# Patient Record
Sex: Female | Born: 1990 | Race: Black or African American | Hispanic: No | Marital: Single | Smoking: Former smoker
Health system: Southern US, Community
[De-identification: ages and names within clinical notes are randomized; demographics above are authoritative.]

---
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
## 2014-06-17 ENCOUNTER — Emergency Department (HOSPITAL_BASED_OUTPATIENT_CLINIC_OR_DEPARTMENT_OTHER)
Admission: EM | Admit: 2014-06-17 | Discharge: 2014-06-17 | Disposition: A | Discharge status: Home / Self Care | Payer: Self-pay | Attending: Emergency Medicine | Admitting: Emergency Medicine

## 2014-06-17 ENCOUNTER — Emergency Department (HOSPITAL_BASED_OUTPATIENT_CLINIC_OR_DEPARTMENT_OTHER): Payer: Self-pay

## 2014-06-17 ENCOUNTER — Encounter (HOSPITAL_BASED_OUTPATIENT_CLINIC_OR_DEPARTMENT_OTHER): Payer: Self-pay | Admitting: *Deleted

## 2014-06-17 DIAGNOSIS — O26899 Other specified pregnancy related conditions, unspecified trimester: Secondary | ICD-10-CM

## 2014-06-17 DIAGNOSIS — F17210 Nicotine dependence, cigarettes, uncomplicated: Secondary | ICD-10-CM | POA: Insufficient documentation

## 2014-06-17 DIAGNOSIS — O26851 Spotting complicating pregnancy, first trimester: Secondary | ICD-10-CM

## 2014-06-17 DIAGNOSIS — R109 Unspecified abdominal pain: Secondary | ICD-10-CM

## 2014-06-17 DIAGNOSIS — O034 Incomplete spontaneous abortion without complication: Secondary | ICD-10-CM

## 2014-06-17 DIAGNOSIS — N939 Abnormal uterine and vaginal bleeding, unspecified: Secondary | ICD-10-CM

## 2014-06-17 DIAGNOSIS — O99331 Smoking (tobacco) complicating pregnancy, first trimester: Secondary | ICD-10-CM | POA: Insufficient documentation

## 2014-06-17 DIAGNOSIS — Z3A11 11 weeks gestation of pregnancy: Secondary | ICD-10-CM | POA: Insufficient documentation

## 2014-06-17 LAB — CBC WITH DIFFERENTIAL/PLATELET
BASOS ABS: 0 10*3/uL (ref 0.0–0.1)
BASOS PCT: 0 % (ref 0–1)
Eosinophils Absolute: 0.1 10*3/uL (ref 0.0–0.7)
Eosinophils Relative: 2 % (ref 0–5)
HEMATOCRIT: 30.3 % — AB (ref 36.0–46.0)
Hemoglobin: 10.3 g/dL — ABNORMAL LOW (ref 12.0–15.0)
Lymphocytes Relative: 42 % (ref 12–46)
Lymphs Abs: 2.8 10*3/uL (ref 0.7–4.0)
MCH: 28.1 pg (ref 26.0–34.0)
MCHC: 34 g/dL (ref 30.0–36.0)
MCV: 82.8 fL (ref 78.0–100.0)
MONO ABS: 0.5 10*3/uL (ref 0.1–1.0)
Monocytes Relative: 8 % (ref 3–12)
NEUTROS ABS: 3.1 10*3/uL (ref 1.7–7.7)
Neutrophils Relative %: 48 % (ref 43–77)
PLATELETS: 217 10*3/uL (ref 150–400)
RBC: 3.66 MIL/uL — ABNORMAL LOW (ref 3.87–5.11)
RDW: 13.1 % (ref 11.5–15.5)
WBC: 6.6 10*3/uL (ref 4.0–10.5)

## 2014-06-17 LAB — HCG, QUANTITATIVE, PREGNANCY: hCG, Beta Chain, Quant, S: 4576 m[IU]/mL — ABNORMAL HIGH (ref <5)

## 2014-06-17 LAB — BASIC METABOLIC PANEL
Anion gap: 3 — ABNORMAL LOW (ref 5–15)
BUN: 12 mg/dL (ref 6–23)
CO2: 24 mmol/L (ref 19–32)
Calcium: 9 mg/dL (ref 8.4–10.5)
Chloride: 107 mmol/L (ref 96–112)
Creatinine, Ser: 0.37 mg/dL — ABNORMAL LOW (ref 0.50–1.10)
GFR calc Af Amer: >90 mL/min (ref >90)
Glucose, Bld: 97 mg/dL (ref 70–99)
POTASSIUM: 3.8 mmol/L (ref 3.5–5.1)
Sodium: 134 mmol/L — ABNORMAL LOW (ref 135–145)

## 2014-06-17 LAB — URINALYSIS, ROUTINE W REFLEX MICROSCOPIC
BILIRUBIN URINE: NEGATIVE
Glucose, UA: NEGATIVE mg/dL
Ketones, ur: NEGATIVE mg/dL
LEUKOCYTES UA: NEGATIVE
NITRITE: NEGATIVE
Protein, ur: NEGATIVE mg/dL
Specific Gravity, Urine: 1.014 (ref 1.005–1.030)
Urobilinogen, UA: 0.2 mg/dL (ref 0.0–1.0)
pH: 6 (ref 5.0–8.0)

## 2014-06-17 LAB — WET PREP, GENITAL
TRICH WET PREP: NONE SEEN
YEAST WET PREP: NONE SEEN

## 2014-06-17 LAB — URINE MICROSCOPIC-ADD ON

## 2014-06-17 LAB — ABO/RH: ABO/RH(D): O POS

## 2014-06-17 LAB — PREGNANCY, URINE: PREG TEST UR: POSITIVE — AB

## 2014-06-17 NOTE — ED Provider Notes (Signed)
CSN: [id number]     Arrival date & time 06/17/14  1443 History   First MD Initiated Contact with Patient 06/17/14 1629     Chief Complaint  Patient presents with  . Abdominal Pain     (Consider location/radiation/quality/duration/timing/severity/associated sxs/prior Treatment) HPI Jean Duncan is a 24 year old female G1 P0 at 5611 weeks gestation who presents the ER complaining of vaginal bleeding. Patient reports she was seen and evaluated in the ER in [facility] 5 days ago for a pink vaginal discharge and lower abdominal cramping. She reports over the weekend she began having postcoital bleeding on Saturday, and her bleeding has persisted for the past several days to the point that she has had to change her pad approximately 1 time per day. Patient denies having any persistent abdominal pain, nausea, vomiting, diarrhea. Patient denies having sexual intercourse since Saturday.  History reviewed. No pertinent past medical history. History reviewed. No pertinent past surgical history. No family history on file. History  Substance Use Topics  . Smoking status: Current Every Day Smoker -- 0.00 packs/day    Types: Cigarettes  . Smokeless tobacco: Not on file  . Alcohol Use: No   OB History    No data available     Review of Systems  Constitutional: Negative for fever.  HENT: Negative for trouble swallowing.   Eyes: Negative for visual disturbance.  Respiratory: Negative for shortness of breath.   Cardiovascular: Negative for chest pain.  Gastrointestinal: Negative for nausea, vomiting and abdominal pain.  Genitourinary: Positive for vaginal bleeding. Negative for dysuria, vaginal discharge and vaginal pain.  Musculoskeletal: Negative for neck pain.  Skin: Negative for rash.  Neurological: Negative for dizziness, weakness and numbness.  Psychiatric/Behavioral: Negative.       Allergies  Review of patient's allergies indicates no known allergies.  Home  Medications   Prior to Admission medications   Not on File   BP 126/61 mmHg  Pulse 77  Temp(Src) 99 F (37.2 C) (Oral)  Resp 16  Ht 5\' 7"  (1.702 m)  Wt 179 lb (81.194 kg)  BMI 28.03 kg/m2  SpO2 100%  LMP 04/04/2014 Physical Exam  Constitutional: She is oriented to person, place, and time. She appears well-developed and well-nourished. No distress.  HENT:  Head: Normocephalic and atraumatic.  Mouth/Throat: Oropharynx is clear and moist. No oropharyngeal exudate.  Eyes: Right eye exhibits no discharge. Left eye exhibits no discharge. No scleral icterus.  Neck: Normal range of motion.  Cardiovascular: Normal rate, regular rhythm and normal heart sounds.   No murmur heard. Pulmonary/Chest: Effort normal and breath sounds normal. No respiratory distress.  Abdominal: Soft. Normal appearance and bowel sounds are normal. There is no tenderness. There is no rigidity, no guarding, no tenderness at McBurney's point and negative Murphy's sign.  Genitourinary: No labial fusion. There is no rash, tenderness, lesion or injury on the right labia. There is no rash, tenderness, lesion or injury on the left labia. There is bleeding in the vagina. No erythema or tenderness in the vagina. No foreign body around the vagina. No signs of injury around the vagina.  Mild to moderate amount of dark red blood noted in vaginal vault. No active bleed noted. No cervical motion tenderness, adnexal tenderness, cervical friability or discharge. Chaperone present during entire pelvic exam.  Musculoskeletal: Normal range of motion. She exhibits no edema or tenderness.  Neurological: She is alert and oriented to person, place, and time. No cranial nerve deficit. Coordination normal.  Skin: Skin is  warm and dry. No rash noted. She is not diaphoretic.  Psychiatric: She has a normal mood and affect.  Nursing note and vitals reviewed.   ED Course  Procedures (including critical care time) Labs Review Labs Reviewed   WET PREP, GENITAL - Abnormal; Notable for the following:    Clue Cells Wet Prep HPF POC MANY (*)    WBC, Wet Prep HPF POC MODERATE (*)    All other components within normal limits  URINALYSIS, ROUTINE W REFLEX MICROSCOPIC - Abnormal; Notable for the following:    Hgb urine dipstick TRACE (*)    All other components within normal limits  CBC WITH DIFFERENTIAL/PLATELET - Abnormal; Notable for the following:    RBC 3.66 (*)    Hemoglobin 10.3 (*)    HCT 30.3 (*)    All other components within normal limits  BASIC METABOLIC PANEL - Abnormal; Notable for the following:    Sodium 134 (*)    Creatinine, Ser 0.37 (*)    Anion gap 3 (*)    All other components within normal limits  PREGNANCY, URINE - Abnormal; Notable for the following:    Preg Test, Ur POSITIVE (*)    All other components within normal limits  HCG, QUANTITATIVE, PREGNANCY - Abnormal; Notable for the following:    hCG, Beta Chain, Quant, S 4576 (*)    All other components within normal limits  URINE MICROSCOPIC-ADD ON  ABO/RH  GC/CHLAMYDIA PROBE AMP ([place])    Imaging Review US Ob Comp Less 14 Wks  06/17/2014   CLINICAL DATA:  Initial evaluation for bleeding during intercourse, no pain, with seen in the emergency department on 06/12/2014 for small amount of vaginal bleeding with intercourse again on 06/13/2014  EXAM: OBSTETRIC <14 WK [place] AND TRANSVAGINAL OB US  TECHNIQUE: Both transabdominal and transvaginal ultrasound examinations were performed for complete evaluation of the gestation as well as the maternal uterus, adnexal regions, and pelvic cul-de-sac. Transvaginal technique was performed to assess early pregnancy.  COMPARISON:  06/12/2014  FINDINGS: Intrauterine gestational sac: Gestational sac is identified within the endometrial canal of the lower uterine segment/body junction, representing a definitive change in position when compared to the prior study.  Yolk sac:  Not identified  Embryo:  Not identified   Cardiac Activity: Not identified  Heart Rate: 0  bpm  Sac has decreased in size to 15 and 6mm from previous measurement of 29 x 15mm.  Maternal uterus/adnexae: No subchorionic hemorrhage. Right ovary is normal. Left ovary demonstrates a corpus luteum.  IMPRESSION: The findings appear consistent with an in progress abortion.   Electronically Signed   By: Esperanza Heir M.D.   On: 06/17/2014 20:20   US Ob Transvaginal  06/17/2014   CLINICAL DATA:  Initial evaluation for bleeding during intercourse, no pain, with seen in the emergency department on 06/12/2014 for small amount of vaginal bleeding with intercourse again on 06/13/2014  EXAM: OBSTETRIC <14 WK [place] AND TRANSVAGINAL OB US  TECHNIQUE: Both transabdominal and transvaginal ultrasound examinations were performed for complete evaluation of the gestation as well as the maternal uterus, adnexal regions, and pelvic cul-de-sac. Transvaginal technique was performed to assess early pregnancy.  COMPARISON:  06/12/2014  FINDINGS: Intrauterine gestational sac: Gestational sac is identified within the endometrial canal of the lower uterine segment/body junction, representing a definitive change in position when compared to the prior study.  Yolk sac:  Not identified  Embryo:  Not identified  Cardiac Activity: Not identified  Heart Rate: 0  bpm  Sac has decreased in size to 15 and 6mm from previous measurement of 29 x 15mm.  Maternal uterus/adnexae: No subchorionic hemorrhage. Right ovary is normal. Left ovary demonstrates a corpus luteum.  IMPRESSION: The findings appear consistent with an in progress abortion.   Electronically Signed   By: Esperanza Heir M.D.   On: 06/17/2014 20:20     EKG Interpretation None      MDM   Final diagnoses:  Vaginal bleeding  Incomplete abortion    Patient here pregnant [redacted] weeks with recent evaluation and ultrasound at [facility] for vaginal discharge and lower abdominal cramping. Patient states her workup  was negative there, however I'm unable to access those records tonight. Patient reporting since being seen there she has experienced postcoital bleeding which has persisted. Patient states she had sexual intercourse once, and the bleeding has persisted since for the past 4 days. Patient is not expressing any lower abdominal pain. Workup for rule out of ectopic pregnancy. Patient is not tender on exam, moderate amount of vaginal bleeding noted in vaginal vault. No concern for a surgical abdomen, will follow up with transvaginal ultrasound OB. Patient's lab work unremarkable for acute pathology. Many clue cells noted on wet prep, however patient not experiencing any signs or symptoms of bacterial vaginosis.    Patient's ultrasound with impression: The findings appear consistent with an in progress abortion.  These findings correlated with patient's lower than expected beta hCG, and vaginal bleeding without pain or discomfort tonight, patient's symptoms are likely all attributed to a incomplete abortion. I discussed these results with patient, I strongly encourage her to follow-up with her OB/GYN. I discussed return precautions with patient to include worsening of bleeding or symptoms, and patient verbalizes understanding and agreement of this plan. I encouraged patient to call or return to the ER should she have any questions or concerns.  BP 126/61 mmHg  Pulse 77  Temp(Src) 99 F (37.2 C) (Oral)  Resp 16  Ht  (1.702 m)  Wt 179 lb (81.194 kg)  BMI 28.03 kg/m2  SpO2 100%  LMP 04/04/2014  Signed,  Ladona Mow, PA-C 11:44 PM  Patient discussed with Dr. Gerhard Munch, M.D.    Monte Fantasia, PA-C 06/17/14 2345  Gerhard Munch, MD 06/18/14 [phone]

## 2014-06-17 NOTE — Discharge Instructions (Signed)
Follow-up with your OB/GYN. Return to the ER with any severe bleeding greater than 1 pad per hour, severe abdominal or pelvic pain, high fever, nausea, vomiting.   Incomplete Miscarriage A miscarriage is the sudden loss of an unborn baby (fetus) before the 20th week of pregnancy. In an incomplete miscarriage, parts of the fetus or placenta (afterbirth) remain in the body.  Having a miscarriage can be an emotional experience. Talk with your health care provider about any questions you may have about miscarrying, the grieving process, and your future pregnancy plans. CAUSES   Problems with the fetal chromosomes that make it impossible for the baby to develop normally. Problems with the baby's genes or chromosomes are most often the result of errors that occur by chance as the embryo divides and grows. The problems are not inherited from the parents.  Infection of the cervix or uterus.  Hormone problems.  Problems with the cervix, such as having an incompetent cervix. This is when the tissue in the cervix is not strong enough to hold the pregnancy.  Problems with the uterus, such as an abnormally shaped uterus, uterine fibroids, or congenital abnormalities.  Certain medical conditions.  Smoking, drinking alcohol, or taking illegal drugs.  Trauma. SYMPTOMS   Vaginal bleeding or spotting, with or without cramps or pain.  Pain or cramping in the abdomen or lower back.  Passing fluid, tissue, or blood clots from the vagina. DIAGNOSIS  Your health care provider will perform a physical exam. You may also have an ultrasound to confirm the miscarriage. Blood or urine tests may also be ordered. TREATMENT   Usually, a dilation and curettage (D&C) procedure is performed. During a D&C procedure, the cervix is widened (dilated) and any remaining fetal or placental tissue is gently removed from the uterus.  Antibiotic medicines are prescribed if there is an infection. Other medicines may be given  to reduce the size of the uterus (contract) if there is a lot of bleeding.  If you have Rh negative blood and your baby was Rh positive, you will need a Rho (D) immune globulin shot. This shot will protect any future baby from having Rh blood problems in future pregnancies.  You may be confined to bed rest. This means you should stay in bed and only get up to use the bathroom. HOME CARE INSTRUCTIONS   Rest as directed by your health care provider.  Restrict activity as directed by your health care provider. You may be allowed to continue light activity if curettage was not done but you require further treatment.  Keep track of the number of pads you use each day. Keep track of how soaked (saturated) they are. Record this information.  Do not  use tampons.  Do not douche or have sexual intercourse until approved by your health care provider.  Keep all follow-up appointments for reevaluation and continuing management.  Only take over-the-counter or prescription medicines for pain, fever, or discomfort as directed by your health care provider.  Take antibiotic medicine as directed by your health care provider. Make sure you finish it even if you start to feel better. SEEK IMMEDIATE MEDICAL CARE IF:   You experience severe cramps in your stomach, back, or abdomen.  You have an unexplained temperature (make sure to record these temperatures).  You pass large clots or tissue (save these for your health care provider to inspect).  Your bleeding increases.  You become light-headed, weak, or have fainting episodes. MAKE SURE YOU:   Understand these  instructions.  Will watch your condition.  Will get help right away if you are not doing well or get worse. Document Released: 04/18/2005 Document Revised: 09/02/2013 Document Reviewed: 11/15/2012 [facility] Patient Information 2015 [place], [place]. This information is not intended to replace advice given to you by your health care provider.  Make sure you discuss any questions you have with your health care provider.

## 2014-06-17 NOTE — ED Notes (Addendum)
Abdominal pain since yesterday. Lower abdomen cramping and pink discharge. States she is [redacted] weeks pregnant.

## 2014-06-18 LAB — GC/CHLAMYDIA PROBE AMP (~~LOC~~) NOT AT ARMC
Chlamydia: NEGATIVE
Neisseria Gonorrhea: NEGATIVE

## 2015-06-29 IMAGING — US US OB COMP LESS 14 WK
1 series · 14 of 28 positions shown · non-contrast
Comparison: 06/12/2014

CLINICAL DATA: Initial evaluation for bleeding during intercourse,
no pain, with seen in the emergency department on 06/12/2014 for
small amount of vaginal bleeding with intercourse again on
06/13/2014

EXAM:
OBSTETRIC <14 WK US AND TRANSVAGINAL OB US
TECHNIQUE: Both transabdominal and transvaginal ultrasound examinations were
performed for complete evaluation of the gestation as well as the
maternal uterus, adnexal regions, and pelvic cul-de-sac.
Transvaginal technique was performed to assess early pregnancy.

[Series 1: us ob comp less 14 wk · 0.15mm/px · 37 acquisitions, 14 frames shown]
[im 2/37]
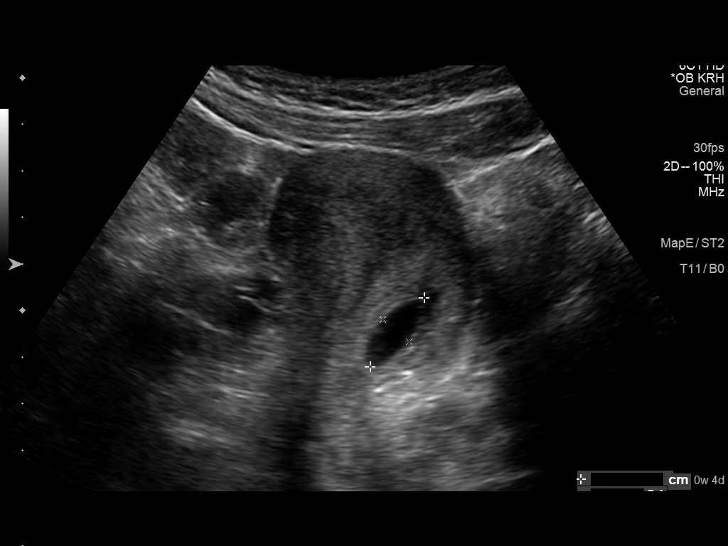
[im 5/37]
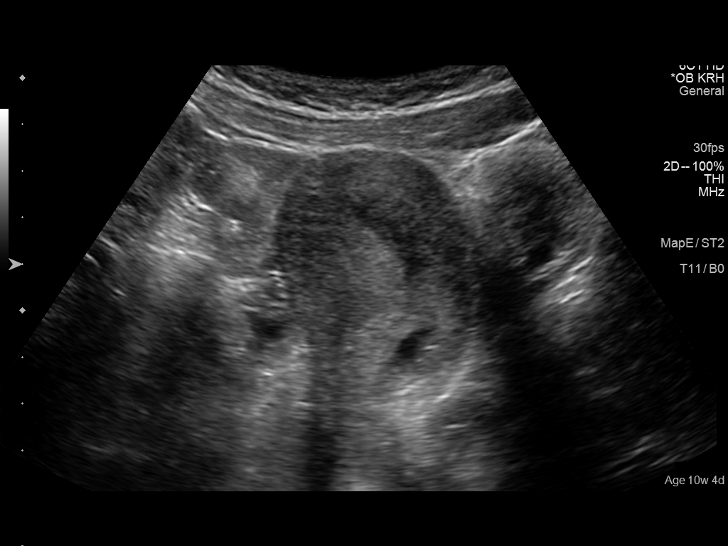
[im 7/37]
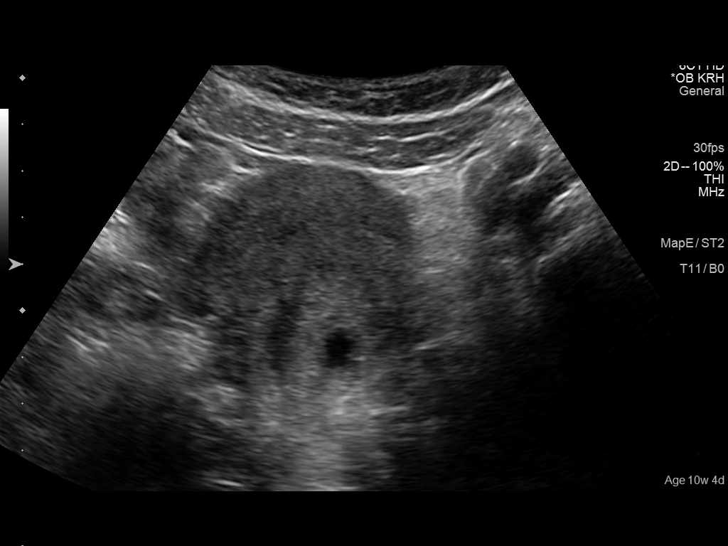
[im 10/37]
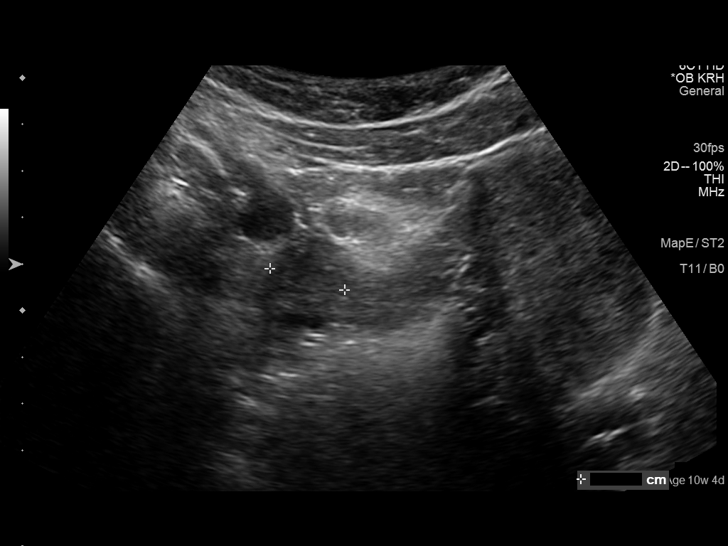
[im 13/37]
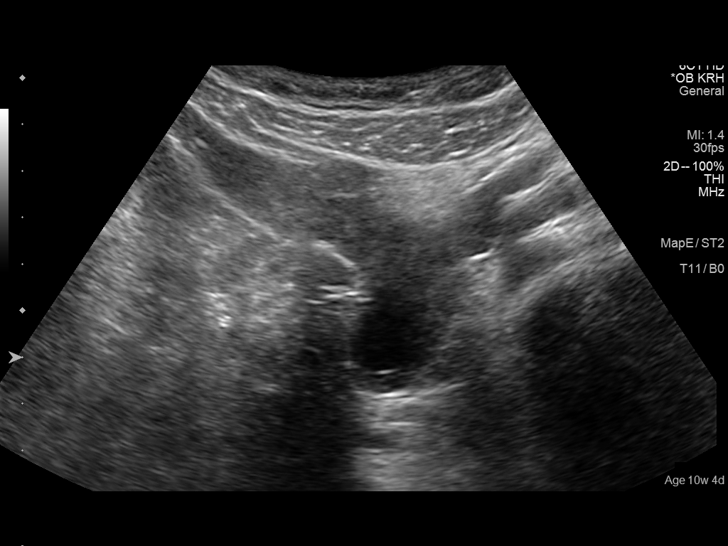
[im 15/37]
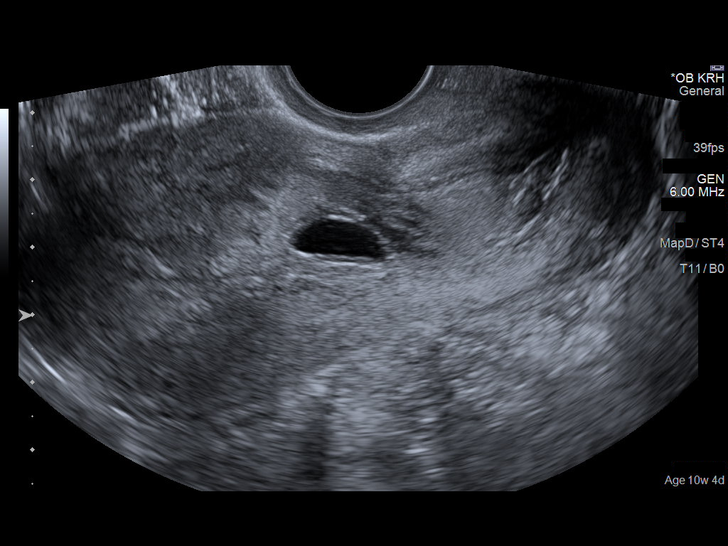
[im 18/37]
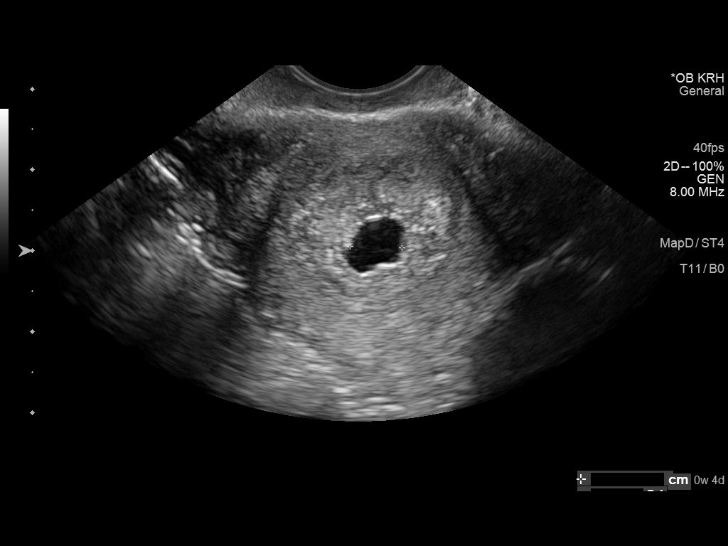
[im 21/37]
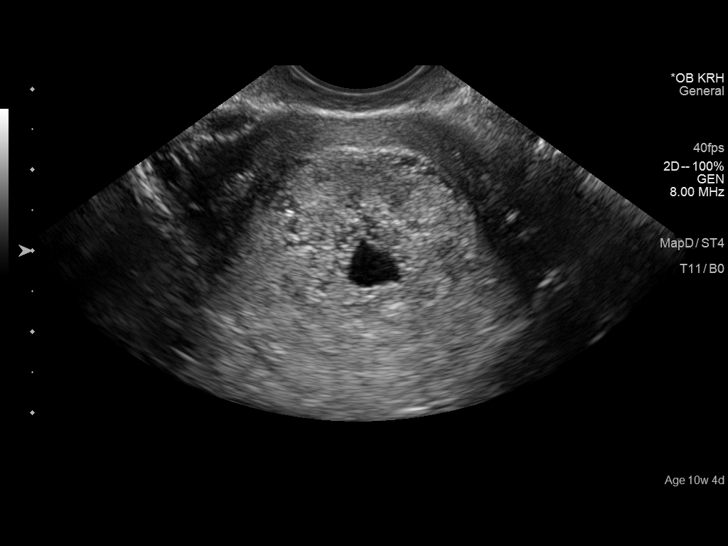
[im 23/37]
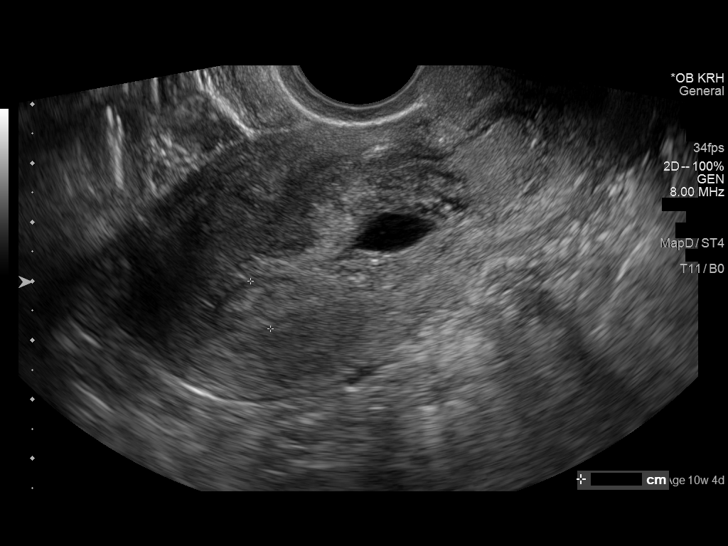
[im 26/37]
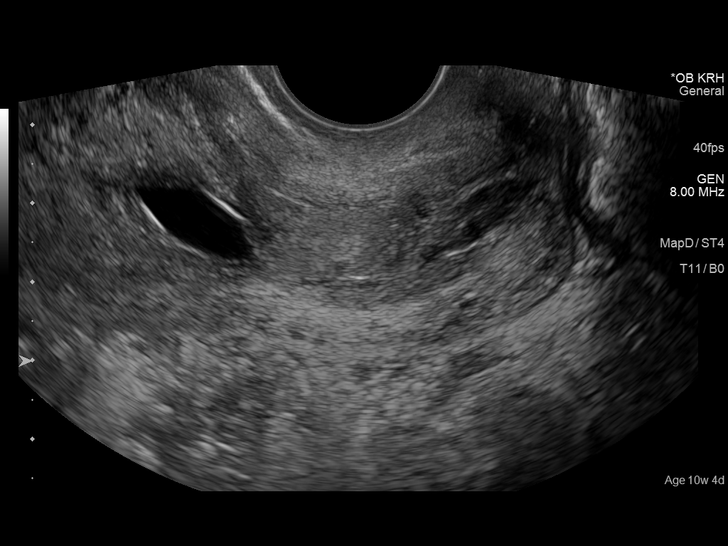
[im 29/37]
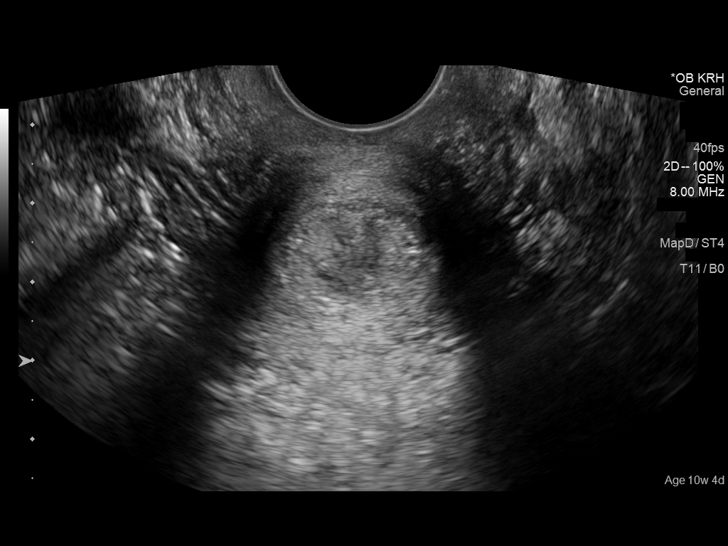
[im 31/37]
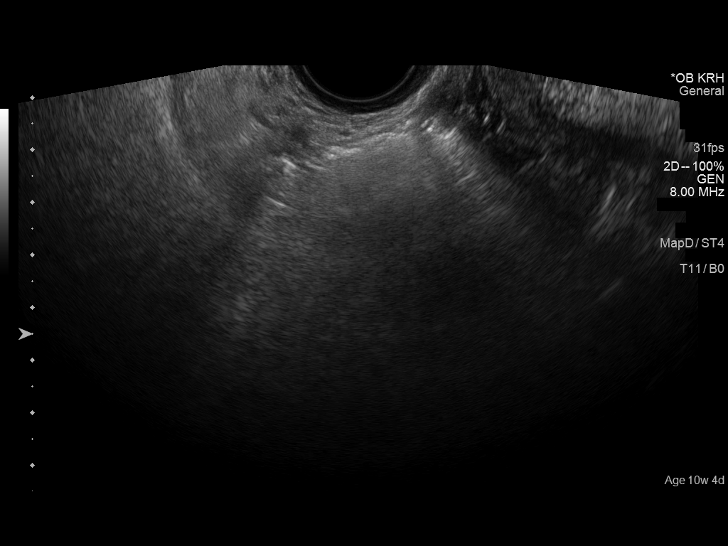
[im 34/37]
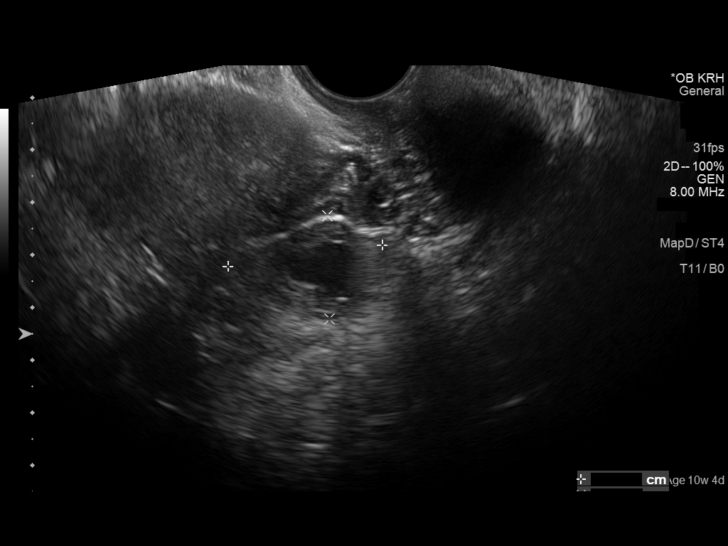
[im 37/37]
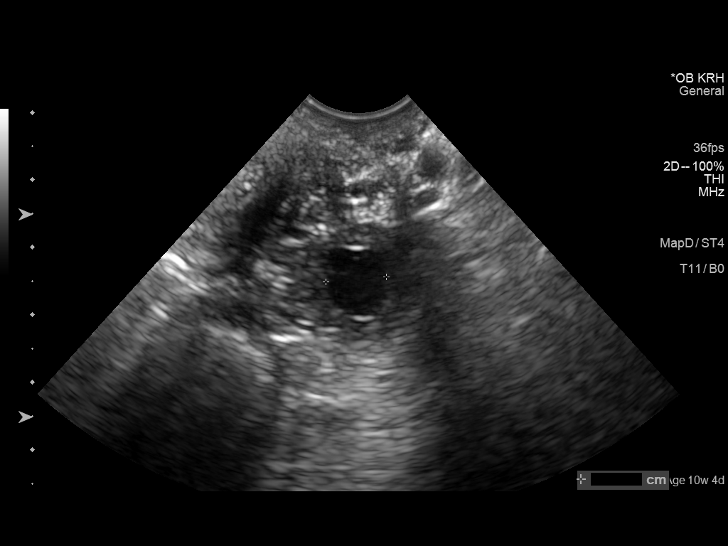

[14 of 28 positions shown; findings below may reference images not displayed]

FINDINGS: Intrauterine gestational sac: Gestational sac is identified within
the endometrial canal of the lower uterine segment/body junction,
representing a definitive change in position when compared to the
prior study.

Yolk sac:  Not identified

Embryo:  Not identified

Cardiac Activity: Not identified

Heart Rate: 0  bpm

Sac has decreased in size to 15 and 6mm from previous measurement of
29 x 15mm.

Maternal uterus/adnexae: No subchorionic hemorrhage. Right ovary is
normal. Left ovary demonstrates a corpus luteum.
IMPRESSION: The findings appear consistent with an in progress abortion.

## 2018-04-27 ENCOUNTER — Other Ambulatory Visit: Payer: Self-pay

## 2018-04-27 ENCOUNTER — Encounter (HOSPITAL_BASED_OUTPATIENT_CLINIC_OR_DEPARTMENT_OTHER): Payer: Self-pay | Admitting: *Deleted

## 2018-04-27 ENCOUNTER — Emergency Department (HOSPITAL_BASED_OUTPATIENT_CLINIC_OR_DEPARTMENT_OTHER)
Admission: EM | Admit: 2018-04-27 | Discharge: 2018-04-27 | Disposition: A | Discharge status: Home / Self Care | Payer: Medicaid Other | Attending: Emergency Medicine | Admitting: Emergency Medicine

## 2018-04-27 DIAGNOSIS — R8271 Bacteriuria: Secondary | ICD-10-CM

## 2018-04-27 DIAGNOSIS — Z87891 Personal history of nicotine dependence: Secondary | ICD-10-CM | POA: Insufficient documentation

## 2018-04-27 DIAGNOSIS — O210 Mild hyperemesis gravidarum: Secondary | ICD-10-CM | POA: Diagnosis present

## 2018-04-27 DIAGNOSIS — Z3A00 Weeks of gestation of pregnancy not specified: Secondary | ICD-10-CM | POA: Diagnosis not present

## 2018-04-27 LAB — CBC WITH DIFFERENTIAL/PLATELET
Abs Immature Granulocytes: 0.03 10*3/uL (ref 0.00–0.07)
Basophils Absolute: 0 10*3/uL (ref 0.0–0.1)
Basophils Relative: 1 %
Eosinophils Absolute: 0.1 10*3/uL (ref 0.0–0.5)
Eosinophils Relative: 1 %
HCT: 36.6 % (ref 36.0–46.0)
Hemoglobin: 11.7 g/dL — ABNORMAL LOW (ref 12.0–15.0)
Immature Granulocytes: 1 %
Lymphocytes Relative: 31 %
Lymphs Abs: 1.8 10*3/uL (ref 0.7–4.0)
MCH: 27.7 pg (ref 26.0–34.0)
MCHC: 32 g/dL (ref 30.0–36.0)
MCV: 86.7 fL (ref 80.0–100.0)
Monocytes Absolute: 0.5 10*3/uL (ref 0.1–1.0)
Monocytes Relative: 8 %
Neutro Abs: 3.4 10*3/uL (ref 1.7–7.7)
Neutrophils Relative %: 58 %
Platelets: 235 10*3/uL (ref 150–400)
RBC: 4.22 MIL/uL (ref 3.87–5.11)
RDW: 13.5 % (ref 11.5–15.5)
WBC: 5.8 10*3/uL (ref 4.0–10.5)
nRBC: 0 % (ref 0.0–0.2)

## 2018-04-27 LAB — COMPREHENSIVE METABOLIC PANEL
ALT: 15 U/L (ref 0–44)
AST: 18 U/L (ref 15–41)
Albumin: 4.1 g/dL (ref 3.5–5.0)
Alkaline Phosphatase: 49 U/L (ref 38–126)
Anion gap: 10 (ref 5–15)
BUN: 7 mg/dL (ref 6–20)
CO2: 22 mmol/L (ref 22–32)
Calcium: 9.1 mg/dL (ref 8.9–10.3)
Chloride: 101 mmol/L (ref 98–111)
Creatinine, Ser: 0.65 mg/dL (ref 0.44–1.00)
GFR calc Af Amer: >60 mL/min (ref >60)
GFR calc non Af Amer: >60 mL/min (ref >60)
Glucose, Bld: 78 mg/dL (ref 70–99)
Potassium: 3.5 mmol/L (ref 3.5–5.1)
Sodium: 133 mmol/L — ABNORMAL LOW (ref 135–145)
Total Bilirubin: 0.6 mg/dL (ref 0.3–1.2)
Total Protein: 7.6 g/dL (ref 6.5–8.1)

## 2018-04-27 LAB — HCG, QUANTITATIVE, PREGNANCY: hCG, Beta Chain, Quant, S: 63703 m[IU]/mL — ABNORMAL HIGH (ref <5)

## 2018-04-27 LAB — URINALYSIS, ROUTINE W REFLEX MICROSCOPIC
Bilirubin Urine: NEGATIVE
Glucose, UA: NEGATIVE mg/dL
Ketones, ur: >80 mg/dL — AB
Leukocytes, UA: NEGATIVE
Nitrite: POSITIVE — AB
Protein, ur: NEGATIVE mg/dL
Specific Gravity, Urine: 1.025 (ref 1.005–1.030)
pH: 6 (ref 5.0–8.0)

## 2018-04-27 LAB — URINALYSIS, MICROSCOPIC (REFLEX)

## 2018-04-27 LAB — PREGNANCY, URINE: Preg Test, Ur: POSITIVE — AB

## 2018-04-27 LAB — LIPASE, BLOOD: Lipase: 26 U/L (ref 11–51)

## 2018-04-27 MED ORDER — DOXYLAMINE-PYRIDOXINE 10-10 MG PO TBEC: DELAYED_RELEASE_TABLET | ORAL | 0 refills | Status: DC

## 2018-04-27 MED ORDER — CEPHALEXIN 500 MG PO CAPS: 500.0000 mg | ORAL_CAPSULE | Freq: Two times a day (BID) | ORAL | 0 refills | Status: AC

## 2018-04-27 MED ORDER — ONDANSETRON 4 MG PO TBDP: 4.0000 mg | ORAL_TABLET | Freq: Three times a day (TID) | ORAL | 0 refills | Status: AC | PRN

## 2018-04-27 MED ORDER — ONDANSETRON HCL 4 MG/2ML IJ SOLN
4.0000 mg | Freq: Once | INTRAMUSCULAR | Status: AC
  Administered 2018-04-27: 4 mg via INTRAVENOUS
  Filled 2018-04-27: qty 2

## 2018-04-27 MED ORDER — SODIUM CHLORIDE 0.9 % IV BOLUS
1000.0000 mL | Freq: Once | INTRAVENOUS | Status: AC
  Administered 2018-04-27: 1000 mL via INTRAVENOUS

## 2018-04-27 NOTE — ED Triage Notes (Signed)
Vomiting x 2 days. She may be pregnant.  

## 2018-04-27 NOTE — Discharge Instructions (Signed)
1. Medications: Please take all of your antibiotics until finished!   You may develop abdominal discomfort or diarrhea from the antibiotic.  You may help offset this with probiotics which you can buy or get in yogurt. Do not eat  or take the probiotics until 2 hours after your antibiotic.  Start taking Diclegis as prescribed as needed for nausea and vomiting.  If the medication is expensive, you can buy the individual components (doxylamine and pyridoxine which is also known as vitamin B6) over-the-counter and take 2 pills instead of 1.  If the Diclegis is not working you can take Zofran as needed for nausea as a backup.  Let the medication dissolve under your tongue.  Wait around 20 minutes before eating or drinking after taking this medication. 2. Treatment: rest, drink plenty of fluids, advance diet slowly.  Make sure that you are eating small meals frequently throughout the day every 2-3 hours to make sure that your stomach is not too full or to empty.  You may find it helpful to eat ginger products such as ginger ale or chew on ginger root.  Avoid fried foods, fatty foods, acidic foods, or spicy foods that may make her symptoms worse.  Bland foods can be helpful. 3. Follow Up: Please followup with your primary doctor in 3 days for discussion of your diagnoses and further evaluation after today's visit; follow-up with OB/GYN to obtain confirmation ultrasound and for follow-up; Please return to the ER for persistent vomiting, high fevers, vaginal bleeding, or worsening symptoms

## 2018-04-27 NOTE — ED Provider Notes (Signed)
[facility] EMERGENCY DEPARTMENT Provider Note   CSN: [id number] Arrival date & time: 04/27/18  1712     History   Chief Complaint Chief Complaint  Patient presents with  . Emesis  . Amenorrhea    HPI Jean Duncan is a 27 y.o. female presents for evaluation of fatigue for 1 week with nausea and vomiting.  She notes that she feels generally fatigued and nauseated.  Reports that over the last 2 days she has not been able to tolerate any p.o. intake.  Denies abdominal pain, chest pain, shortness of breath, fevers, nasal congestion, sore throat, or cough.  No diarrhea, constipation, urinary symptoms (other than frequency), vaginal itching, bleeding, or discharge.  Last menstrual period was November 17.  Has not tried anything for her symptoms.  The history is provided by the patient.    History reviewed. No pertinent past medical history.  There are no active problems to display for this patient.   History reviewed. No pertinent surgical history.   OB History   No obstetric history on file.      Home Medications    Prior to Admission medications   Medication Sig Start Date End Date Taking? Authorizing Provider  cephALEXin (KEFLEX) 500 MG capsule Take 1 capsule (500 mg total) by mouth 2 (two) times daily for 7 days. 04/27/18 05/04/18  Michela PitcherFawze, Carzell Saldivar A, PA-C  Doxylamine-Pyridoxine 10-10 MG TBEC Two tablets at bedtime on day 1 and 2; if symptoms persist, take 1 tablet in morning and 2 tablets at bedtime on day 3; if symptoms persist, may increase to 1 tablet in morning, 1 tablet mid-afternoon, and 2 tablets at bedtime on day 4 04/27/18   Michela PitcherFawze, Mclean Moya A, PA-C  ondansetron (ZOFRAN ODT) 4 MG disintegrating tablet Take 1 tablet (4 mg total) by mouth every 8 (eight) hours as needed for nausea or vomiting. 04/27/18   Jeanie SewerFawze, Ladarien Beeks A, PA-C    Family History No family history on file.  Social History Social History   Tobacco Use  . Smoking status: Former Smoker    Packs/day:  0.00    Types: Cigarettes  . Smokeless tobacco: Never Used  Substance Use Topics  . Alcohol use: No  . Drug use: No     Allergies   Patient has no known allergies.   Review of Systems Review of Systems  Constitutional: Positive for fatigue. Negative for chills and fever.  HENT: Negative for congestion and sore throat.   Respiratory: Negative for cough and shortness of breath.   Cardiovascular: Negative for chest pain.  Gastrointestinal: Positive for nausea and vomiting. Negative for abdominal pain, constipation and diarrhea.  Genitourinary: Positive for frequency. Negative for dysuria, hematuria, urgency, vaginal bleeding, vaginal discharge and vaginal pain.  All other systems reviewed and are negative.    Physical Exam Updated Vital Signs BP 107/75 (BP Location: Left Arm)   Pulse 67   Temp 99.1 F (37.3 C) (Oral)   Resp 16   Ht 5\' 7"  (1.702 m)   Wt 81.2 kg   LMP 03/19/2018   SpO2 100%   BMI 28.04 kg/m   Physical Exam Vitals signs and nursing note reviewed.  Constitutional:      General: She is not in acute distress.    Appearance: She is well-developed.  HENT:     Head: Normocephalic and atraumatic.  Eyes:     General:        Right eye: No discharge.        Left eye:  No discharge.     Conjunctiva/sclera: Conjunctivae normal.  Neck:     Musculoskeletal: Normal range of motion and neck supple.     Vascular: No JVD.     Trachea: No tracheal deviation.  Cardiovascular:     Rate and Rhythm: Normal rate and regular rhythm.     Pulses: Normal pulses.  Pulmonary:     Effort: Pulmonary effort is normal.     Breath sounds: Normal breath sounds.  Abdominal:     General: Abdomen is flat. There is no distension.     Tenderness: There is no abdominal tenderness. There is no right CVA tenderness or left CVA tenderness.     Hernia: No hernia is present.  Musculoskeletal: Normal range of motion.        General: No tenderness.  Skin:    General: Skin is warm and  dry.     Capillary Refill: Capillary refill takes less than 2 seconds.     Findings: No erythema.  Neurological:     Mental Status: She is alert.  Psychiatric:        Behavior: Behavior normal.      ED Treatments / Results  Labs (all labs ordered are listed, but only abnormal results are displayed) Labs Reviewed  URINALYSIS, ROUTINE W REFLEX MICROSCOPIC - Abnormal; Notable for the following components:      Result Value   APPearance CLOUDY (*)    Hgb urine dipstick TRACE (*)    Ketones, ur >80 (*)    Nitrite POSITIVE (*)    All other components within normal limits  PREGNANCY, URINE - Abnormal; Notable for the following components:   Preg Test, Ur POSITIVE (*)    All other components within normal limits  URINALYSIS, MICROSCOPIC (REFLEX) - Abnormal; Notable for the following components:   Bacteria, UA MANY (*)    All other components within normal limits  HCG, QUANTITATIVE, PREGNANCY - Abnormal; Notable for the following components:   hCG, Beta Chain, Quant, S [id number] (*)    All other components within normal limits  CBC WITH DIFFERENTIAL/PLATELET - Abnormal; Notable for the following components:   Hemoglobin 11.7 (*)    All other components within normal limits  COMPREHENSIVE METABOLIC PANEL - Abnormal; Notable for the following components:   Sodium 133 (*)    All other components within normal limits  LIPASE, BLOOD    EKG None  Radiology No results found.  Procedures Procedures (including critical care time)  Medications Ordered in ED Medications  ondansetron (ZOFRAN) injection 4 mg (4 mg Intravenous Given 04/27/18 1941)  sodium chloride 0.9 % bolus 1,000 mL (1,000 mLs Intravenous New Bag/Given 04/27/18 1943)     Initial Impression / Assessment and Plan / ED Course  I have reviewed the triage vital signs and the nursing notes.  Pertinent labs & imaging results that were available during my care of the patient were reviewed by me and considered in my medical  decision making (see chart for details).     Patient presenting with fatigue and vomiting, worsening over the last 2 days.  She is afebrile, vital signs are stable.  She is nontoxic in appearance.  Abdomen is soft and nontender, no peritoneal signs.  Lab work significant for positive pregnancy test with i-STAT hCG of [id number].  Remainder of labs show mild anemia, no metabolic derangements or renal insufficiency.  Lipase within normal limits.  UA shows some ketonuria consistent with dehydration and persistent vomiting as well as some bacteria and positive  nitrites.  Will cover with Keflex.  Low suspicion of ectopic pregnancy given benign abdominal examination.  Doubt acute surgical abdominal pathology.  She was given IV fluids and Zofran and on reevaluation she is resting comfortably in no apparent distress.  She is tolerating p.o. food and fluids in the ED without difficulty.  Recommend follow-up with OB/GYN for reevaluation and confirmatory ultrasound.  Will discharge with a prescription for likely just and Zofran to use as a backup if the Diclegis is too expensive.  She understands not to take both.  Discussed dietary modifications.  Discussed strict ED return precautions.  Patient and her significant other verbalized understanding of and agreement with plan and patient stable for discharge home at this time.  Final Clinical Impressions(s) / ED Diagnoses   Final diagnoses:  Hyperemesis gravidarum  Asymptomatic bacteriuria    ED Discharge Orders         Ordered    cephALEXin (KEFLEX) 500 MG capsule  2 times daily     04/27/18 2040    Doxylamine-Pyridoxine 10-10 MG TBEC     04/27/18 2040    ondansetron (ZOFRAN ODT) 4 MG disintegrating tablet  Every 8 hours PRN     04/27/18 2040           Bennye Alm 04/27/18 2043    Raeford Razor, MD 04/27/18 2332

## 2018-06-14 ENCOUNTER — Encounter (HOSPITAL_BASED_OUTPATIENT_CLINIC_OR_DEPARTMENT_OTHER): Payer: Self-pay | Admitting: Emergency Medicine

## 2018-06-14 ENCOUNTER — Emergency Department (HOSPITAL_BASED_OUTPATIENT_CLINIC_OR_DEPARTMENT_OTHER): Payer: Medicaid Other

## 2018-06-14 ENCOUNTER — Emergency Department (HOSPITAL_BASED_OUTPATIENT_CLINIC_OR_DEPARTMENT_OTHER)
Admission: EM | Admit: 2018-06-14 | Discharge: 2018-06-14 | Disposition: A | Discharge status: Home / Self Care | Payer: Medicaid Other | Attending: Emergency Medicine | Admitting: Emergency Medicine

## 2018-06-14 ENCOUNTER — Other Ambulatory Visit: Payer: Self-pay

## 2018-06-14 DIAGNOSIS — Z87891 Personal history of nicotine dependence: Secondary | ICD-10-CM | POA: Diagnosis not present

## 2018-06-14 DIAGNOSIS — O4691 Antepartum hemorrhage, unspecified, first trimester: Secondary | ICD-10-CM | POA: Diagnosis present

## 2018-06-14 DIAGNOSIS — B9689 Other specified bacterial agents as the cause of diseases classified elsewhere: Secondary | ICD-10-CM | POA: Insufficient documentation

## 2018-06-14 DIAGNOSIS — N3000 Acute cystitis without hematuria: Secondary | ICD-10-CM

## 2018-06-14 DIAGNOSIS — Z3A13 13 weeks gestation of pregnancy: Secondary | ICD-10-CM | POA: Diagnosis not present

## 2018-06-14 DIAGNOSIS — N760 Acute vaginitis: Secondary | ICD-10-CM | POA: Insufficient documentation

## 2018-06-14 DIAGNOSIS — O4690 Antepartum hemorrhage, unspecified, unspecified trimester: Secondary | ICD-10-CM

## 2018-06-14 LAB — CBC WITH DIFFERENTIAL/PLATELET
Abs Immature Granulocytes: 0.07 10*3/uL (ref 0.00–0.07)
Basophils Absolute: 0 10*3/uL (ref 0.0–0.1)
Basophils Relative: 0 %
EOS PCT: 1 %
Eosinophils Absolute: 0.1 10*3/uL (ref 0.0–0.5)
HCT: 33.4 % — ABNORMAL LOW (ref 36.0–46.0)
Hemoglobin: 10.7 g/dL — ABNORMAL LOW (ref 12.0–15.0)
Immature Granulocytes: 1 %
Lymphocytes Relative: 37 %
Lymphs Abs: 2.6 10*3/uL (ref 0.7–4.0)
MCH: 27.5 pg (ref 26.0–34.0)
MCHC: 32 g/dL (ref 30.0–36.0)
MCV: 85.9 fL (ref 80.0–100.0)
Monocytes Absolute: 0.6 10*3/uL (ref 0.1–1.0)
Monocytes Relative: 8 %
NRBC: 0 % (ref 0.0–0.2)
Neutro Abs: 3.7 10*3/uL (ref 1.7–7.7)
Neutrophils Relative %: 53 %
Platelets: 229 10*3/uL (ref 150–400)
RBC: 3.89 MIL/uL (ref 3.87–5.11)
RDW: 13.1 % (ref 11.5–15.5)
WBC: 7.1 10*3/uL (ref 4.0–10.5)

## 2018-06-14 LAB — COMPREHENSIVE METABOLIC PANEL
ALK PHOS: 41 U/L (ref 38–126)
ALT: 13 U/L (ref 0–44)
AST: 13 U/L — ABNORMAL LOW (ref 15–41)
Albumin: 3.8 g/dL (ref 3.5–5.0)
Anion gap: 9 (ref 5–15)
BILIRUBIN TOTAL: 0.3 mg/dL (ref 0.3–1.2)
BUN: 5 mg/dL — ABNORMAL LOW (ref 6–20)
CO2: 21 mmol/L — ABNORMAL LOW (ref 22–32)
CREATININE: 0.44 mg/dL (ref 0.44–1.00)
Calcium: 9.1 mg/dL (ref 8.9–10.3)
Chloride: 102 mmol/L (ref 98–111)
GFR calc Af Amer: >60 mL/min (ref >60)
GFR calc non Af Amer: >60 mL/min (ref >60)
Glucose, Bld: 78 mg/dL (ref 70–99)
Potassium: 3.6 mmol/L (ref 3.5–5.1)
Sodium: 132 mmol/L — ABNORMAL LOW (ref 135–145)
Total Protein: 7.3 g/dL (ref 6.5–8.1)

## 2018-06-14 LAB — URINALYSIS, ROUTINE W REFLEX MICROSCOPIC
Bilirubin Urine: NEGATIVE
Glucose, UA: NEGATIVE mg/dL
Ketones, ur: NEGATIVE mg/dL
Leukocytes,Ua: NEGATIVE
Nitrite: POSITIVE — AB
Protein, ur: NEGATIVE mg/dL
SPECIFIC GRAVITY, URINE: 1.015 (ref 1.005–1.030)
pH: 6 (ref 5.0–8.0)

## 2018-06-14 LAB — URINALYSIS, MICROSCOPIC (REFLEX)

## 2018-06-14 LAB — WET PREP, GENITAL
SPERM: NONE SEEN
Trich, Wet Prep: NONE SEEN
Yeast Wet Prep HPF POC: NONE SEEN

## 2018-06-14 LAB — PREGNANCY, URINE: Preg Test, Ur: POSITIVE — AB

## 2018-06-14 LAB — HCG, QUANTITATIVE, PREGNANCY: hCG, Beta Chain, Quant, S: 87060 m[IU]/mL — ABNORMAL HIGH (ref <5)

## 2018-06-14 MED ORDER — CEPHALEXIN 500 MG PO CAPS: 500.0000 mg | ORAL_CAPSULE | Freq: Three times a day (TID) | ORAL | 0 refills | Status: AC

## 2018-06-14 MED ORDER — SODIUM CHLORIDE 0.9 % IV BOLUS
1000.0000 mL | Freq: Once | INTRAVENOUS | Status: AC
  Administered 2018-06-14: 1000 mL via INTRAVENOUS

## 2018-06-14 MED ORDER — METRONIDAZOLE 500 MG PO TABS: 500.0000 mg | ORAL_TABLET | Freq: Two times a day (BID) | ORAL | 0 refills | Status: AC

## 2018-06-14 MED ORDER — KETOROLAC TROMETHAMINE 30 MG/ML IJ SOLN: 30.0000 mg | Freq: Once | INTRAMUSCULAR | Status: DC

## 2018-06-14 NOTE — Discharge Instructions (Signed)

## 2018-06-14 NOTE — ED Triage Notes (Signed)
Reports she is [redacted] weeks pregnant and began having some light vaginal bleeding today.  Reports this as spotting.  Denies abdominal cramping.  States she does not have obgyn at the present date.

## 2018-06-14 NOTE — ED Provider Notes (Signed)
Emergency Department Provider Note   I have reviewed the triage vital signs and the nursing notes.   HISTORY  Chief Complaint Vaginal Bleeding   HPI Jean Duncan is a 28 y.o. female G2P0 presents to the emergency department with pink material when wiping today after urinating.  The patient is approximately [redacted] weeks pregnant.  She has not established an OB provider and does not have an ultrasound confirming intrauterine pregnancy.  Was seen for hyperemesis in late December and states she has been trying to get Medicaid coverage in order to see an OB.  Her previous pregnancy ended in miscarriage and began with spotting which is why she is concerned.  She is not experiencing any lower abdominal pain, cramping, nausea, or vomiting.  History reviewed. No pertinent past medical history.  There are no active problems to display for this patient.   History reviewed. No pertinent surgical history.  Allergies Patient has no known allergies.  History reviewed. No pertinent family history.  Social History Social History   Tobacco Use  . Smoking status: Former Smoker    Packs/day: 0.00    Types: Cigarettes  . Smokeless tobacco: Never Used  Substance Use Topics  . Alcohol use: No  . Drug use: No    Review of Systems  Constitutional: No fever/chills Eyes: No visual changes. ENT: No sore throat. Cardiovascular: Denies chest pain. Respiratory: Denies shortness of breath. Gastrointestinal: No abdominal pain.  No nausea, no vomiting.  No diarrhea.  No constipation. Genitourinary: Negative for dysuria. Positive vaginal bleeding.  Musculoskeletal: Negative for back pain. Skin: Negative for rash. Neurological: Negative for headaches, focal weakness or numbness.  10-point ROS otherwise negative.  ____________________________________________   PHYSICAL EXAM:  VITAL SIGNS: ED Triage Vitals  Enc Vitals Group     BP 06/14/18 1835 119/69     Pulse Rate 06/14/18 1835 74   Resp 06/14/18 1835 16     Temp 06/14/18 1835 97.8 F (36.6 C)     Temp Source 06/14/18 1835 Oral     SpO2 06/14/18 1835 97 %     Weight 06/14/18 1836 175 lb (79.4 kg)     Height 06/14/18 1836 5\' 7"  (1.702 m)     Pain Score 06/14/18 1836 2   Constitutional: Alert and oriented. Well appearing and in no acute distress. Eyes: Conjunctivae are normal.  Head: Atraumatic. Nose: No congestion/rhinnorhea. Mouth/Throat: Mucous membranes are moist.  Oropharynx non-erythematous. Neck: No stridor.   Cardiovascular: Normal rate, regular rhythm. Good peripheral circulation. Grossly normal heart sounds.   Respiratory: Normal respiratory effort.  No retractions. Lungs CTAB. Gastrointestinal: Soft and nontender. No distention.  Genitourinary: Chaperone present for exam. No vaginal bleeding. Closed cervix. Pink fluid noted on exam.  Musculoskeletal: No lower extremity tenderness nor edema. No gross deformities of extremities. Neurologic:  Normal speech and language. No gross focal neurologic deficits are appreciated.  Skin:  Skin is warm, dry and intact. No rash noted.  ____________________________________________   LABS (all labs ordered are listed, but only abnormal results are displayed)  Labs Reviewed  WET PREP, GENITAL - Abnormal; Notable for the following components:      Result Value   Clue Cells Wet Prep HPF POC PRESENT (*)    WBC, Wet Prep HPF POC MODERATE (*)    All other components within normal limits  PREGNANCY, URINE - Abnormal; Notable for the following components:   Preg Test, Ur POSITIVE (*)    All other components within normal limits  COMPREHENSIVE METABOLIC  PANEL - Abnormal; Notable for the following components:   Sodium 132 (*)    CO2 21 (*)    BUN 5 (*)    AST 13 (*)    All other components within normal limits  CBC WITH DIFFERENTIAL/PLATELET - Abnormal; Notable for the following components:   Hemoglobin 10.7 (*)    HCT 33.4 (*)    All other components within normal  limits  URINALYSIS, ROUTINE W REFLEX MICROSCOPIC - Abnormal; Notable for the following components:   Hgb urine dipstick SMALL (*)    Nitrite POSITIVE (*)    All other components within normal limits  HCG, QUANTITATIVE, PREGNANCY - Abnormal; Notable for the following components:   hCG, Beta Chain, Quant, S 87,060 (*)    All other components within normal limits  URINALYSIS, MICROSCOPIC (REFLEX) - Abnormal; Notable for the following components:   Bacteria, UA MANY (*)    All other components within normal limits  HIV ANTIBODY (ROUTINE TESTING W REFLEX)  RPR  GC/CHLAMYDIA PROBE AMP ([place]) NOT AT [facility]   ____________________________________________  RADIOLOGY  TVUS reviewed. IUP with normal HR. No acute findings.  ____________________________________________   PROCEDURES  Procedure(s) performed:   Procedures  None ____________________________________________   INITIAL IMPRESSION / ASSESSMENT AND PLAN / ED COURSE  Pertinent labs & imaging results that were available during my care of the patient were reviewed by me and considered in my medical decision making (see chart for details).  Patient presents to the emergency department for evaluation of vaginal spotting during pregnancy.  She is approximately [redacted] weeks pregnant by dates.  No abdominal tenderness.  Stable vital signs.  Plan for pelvic ultrasound, labs, and reassess.  Labs reviewed which show evidence of bacterial vaginosis and likely developing urinary tract infection.  Plan to cover with Flagyl and Keflex with increased risk for miscarriage given these conditions.  No sign of impending miscarriage on my evaluation.  Ultrasound is normal.  Strongly advised the patient to follow-up with OB/GYN as soon as possible and provided contact information to do so.  Discussed ED return precautions in detail. ____________________________________________  FINAL CLINICAL IMPRESSION(S) / ED DIAGNOSES  Final diagnoses:  Vaginal  bleeding in pregnancy  Acute cystitis without hematuria  BV (bacterial vaginosis)     MEDICATIONS GIVEN DURING THIS VISIT:  Medications  sodium chloride 0.9 % bolus 1,000 mL (0 mLs Intravenous Stopped 06/14/18 2040)     NEW OUTPATIENT MEDICATIONS STARTED DURING THIS VISIT:  Discharge Medication List as of 06/14/2018 11:01 PM    START taking these medications   Details  cephALEXin (KEFLEX) 500 MG capsule Take 1 capsule (500 mg total) by mouth 3 (three) times daily for 7 days., Starting Thu 06/14/2018, Until Thu 06/21/2018, Print    metroNIDAZOLE (FLAGYL) 500 MG tablet Take 1 tablet (500 mg total) by mouth 2 (two) times daily for 7 days., Starting Thu 06/14/2018, Until Thu 06/21/2018, Print        Note:  This document was prepared using Dragon voice recognition software and may include unintentional dictation errors.  Alona Bene, MD Emergency Medicine    , Arlyss Repress, MD 06/15/18 1019

## 2018-06-14 NOTE — ED Notes (Signed)
Patient transported to Ultrasound 

## 2018-06-14 NOTE — ED Notes (Signed)
Pt verbalizes understanding of d/c instructions and denies any further needs at this time. 

## 2018-06-15 LAB — GC/CHLAMYDIA PROBE AMP (~~LOC~~) NOT AT ARMC
Chlamydia: NEGATIVE
Neisseria Gonorrhea: NEGATIVE

## 2018-06-16 LAB — HIV ANTIBODY (ROUTINE TESTING W REFLEX): HIV SCREEN 4TH GENERATION: NONREACTIVE

## 2018-06-16 LAB — RPR: RPR Ser Ql: NONREACTIVE

## 2020-11-10 IMAGING — US US OB COMP LESS 14 WK
1 series · 13 of 13 positions shown · non-contrast
Comparison: None.

CLINICAL DATA: Vaginal bleeding

EXAM:
OBSTETRIC <14 WK ULTRASOUND
TECHNIQUE: Transabdominal ultrasound was performed for evaluation of the
gestation as well as the maternal uterus and adnexal regions.

[Series 2: us ob comp less 14 wk · 0.15mm/px · 13 acquisitions, 13 frames shown]
[im 1/13]
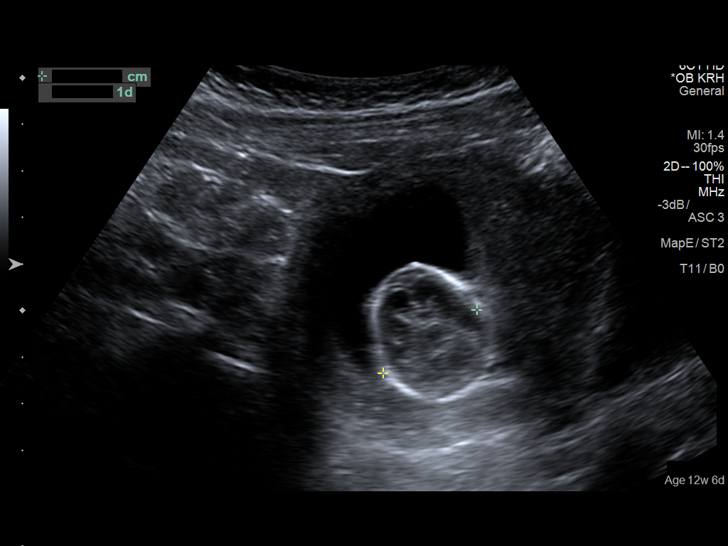
[im 2/13]
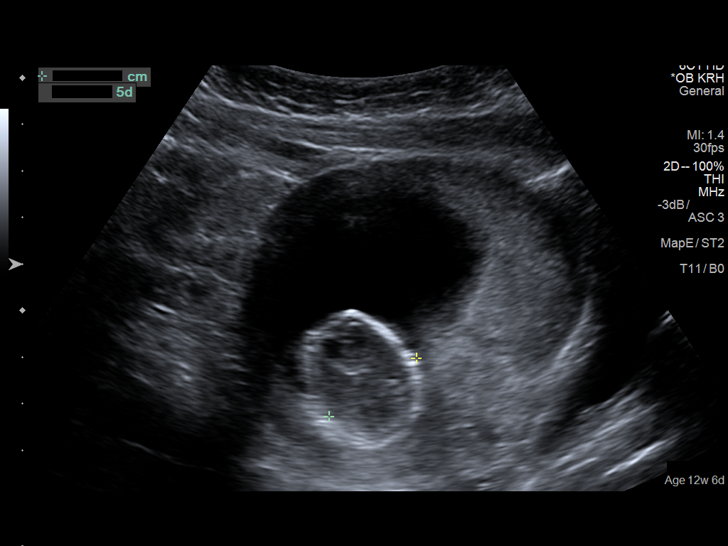
[im 3/13]
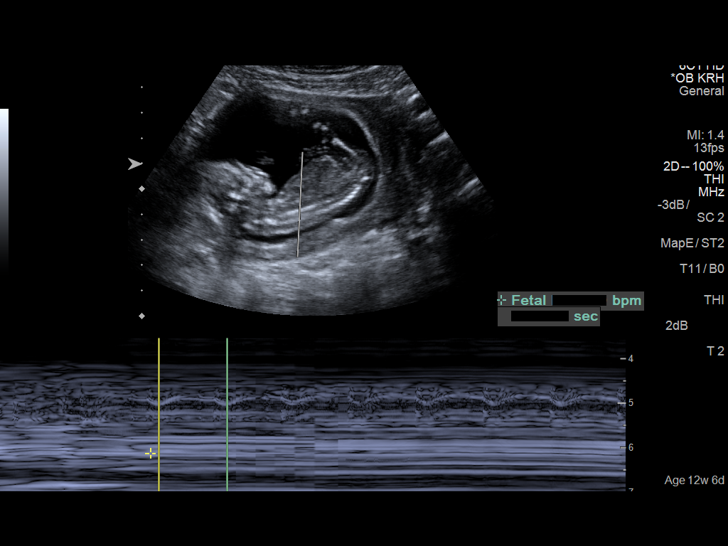
[im 4/13]
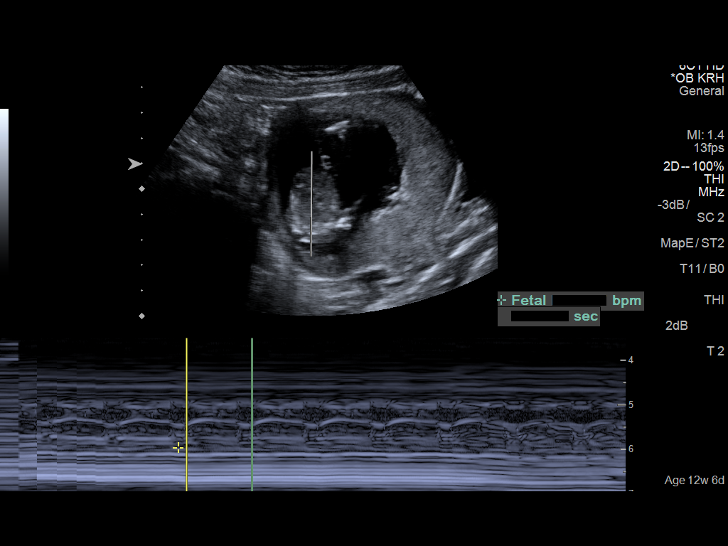
[im 5/13]
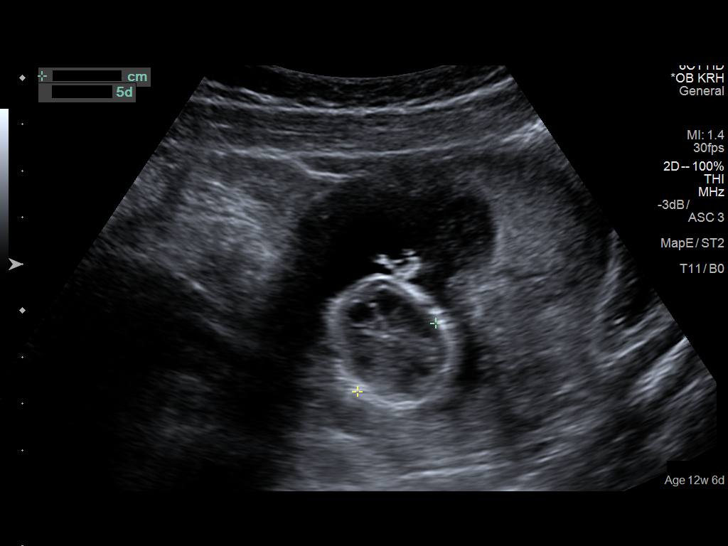
[im 6/13]
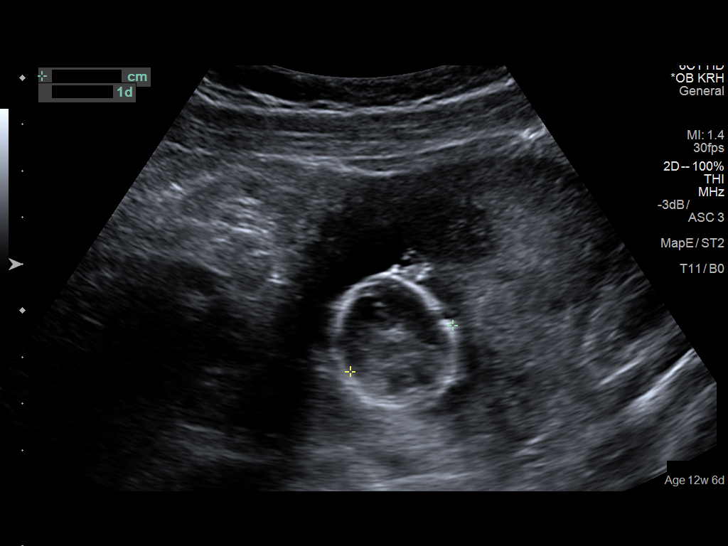
[im 7/13]
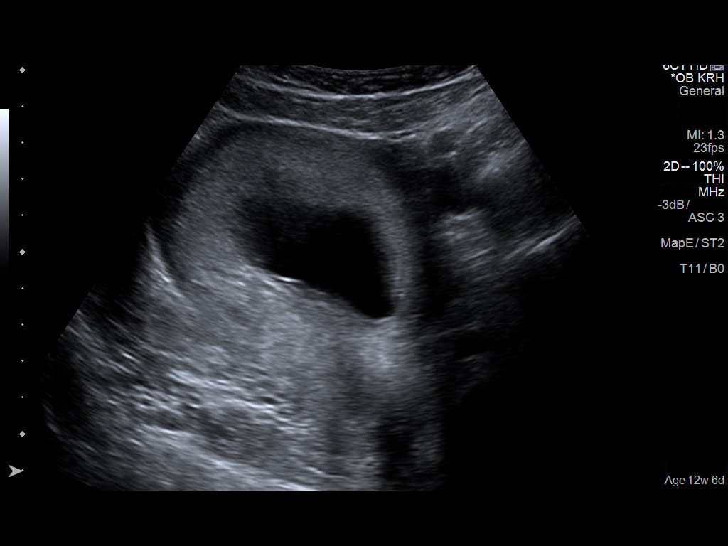
[im 8/13]
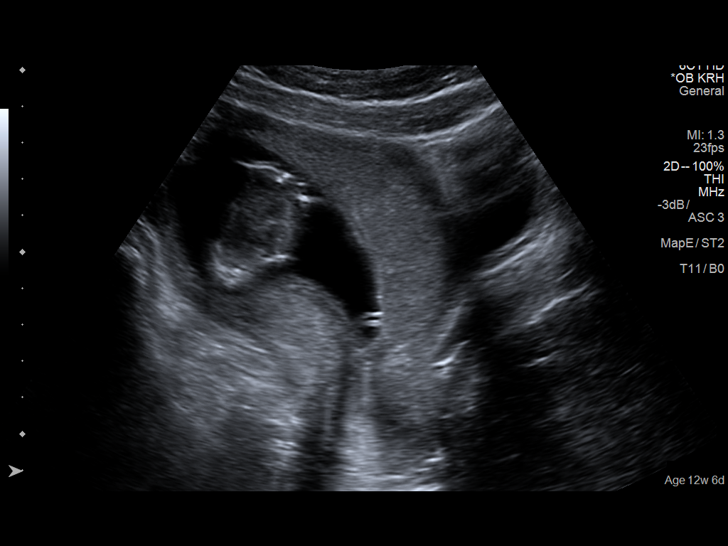
[im 9/13]
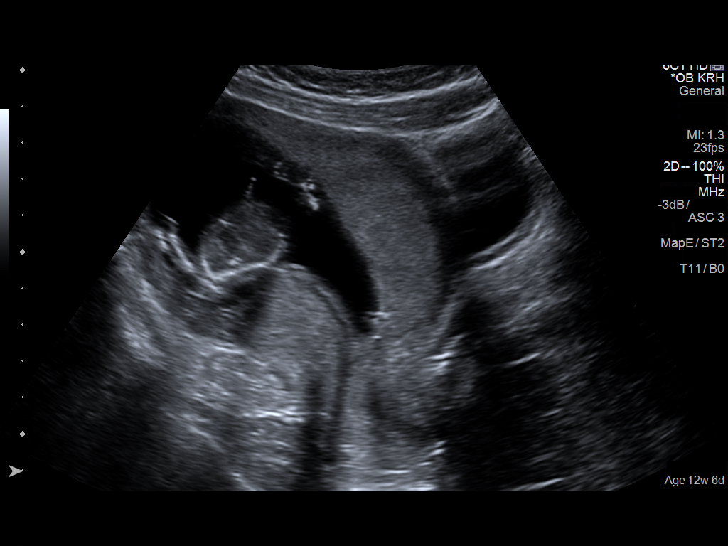
[im 10/13]
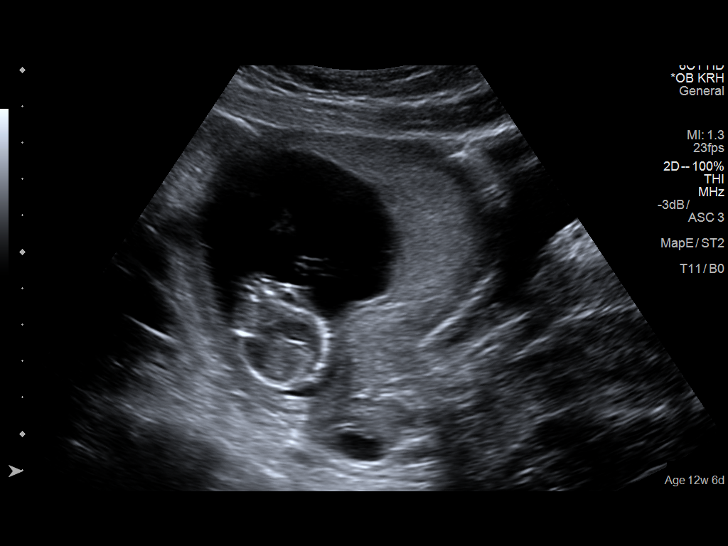
[im 11/13]
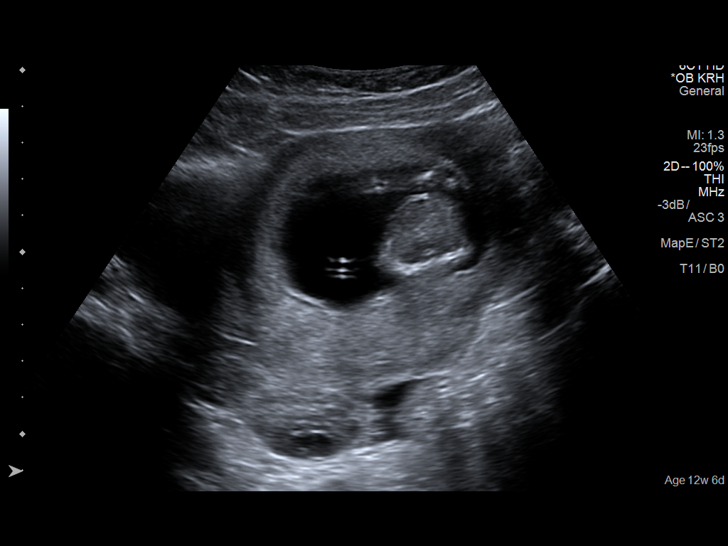
[im 12/13]
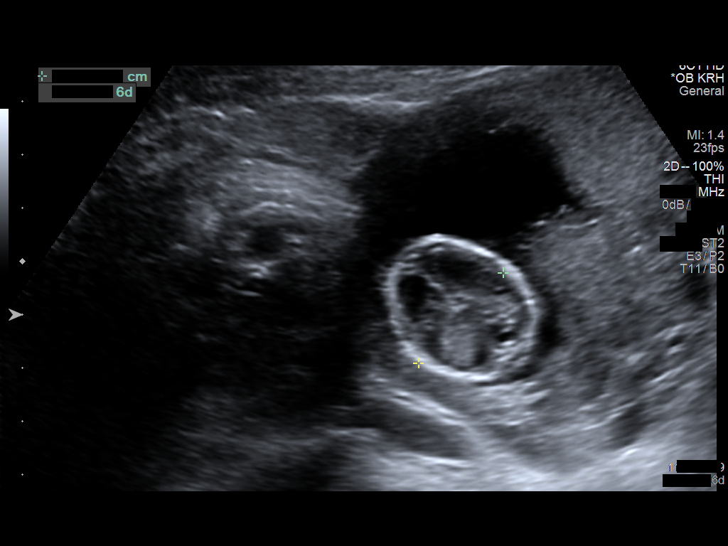
[im 13/13]
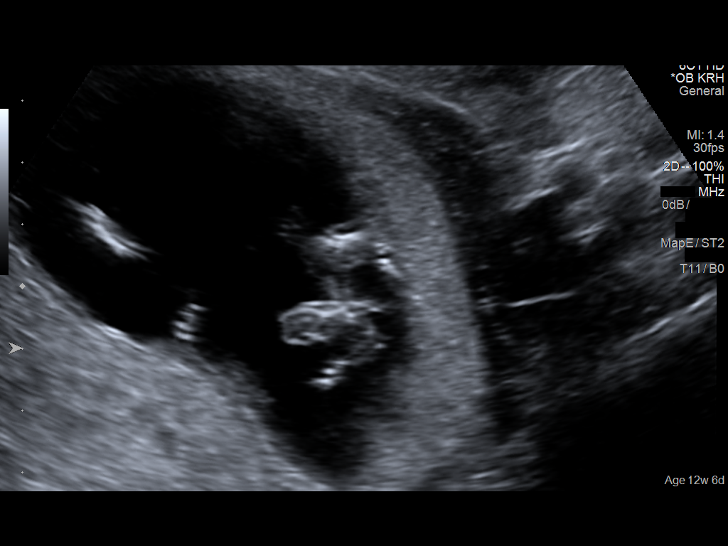

[13 of 13 positions shown; findings below may reference images not displayed]

FINDINGS: Intrauterine gestational sac: Single

Yolk sac:  Visualized

Embryo:  Visualized

Cardiac Activity: Visualized

Heart Rate: 157 bpm

MSD:    mm    w     d

CRL:   23 mm   13 w 6 d                  US EDC: 12/14/2018

Subchorionic hemorrhage:  None visualized.

Maternal uterus/adnexae: No adnexal mass. No free fluid. Low lying
placenta, likely related to early pregnancy.. This could be followed
with repeat ultrasounds later in pregnancy to confirm this resolves.
IMPRESSION: Thirteen week 6 day intrauterine pregnancy. Fetal heart rate 157
beats per minute. No acute maternal findings.

## 2022-12-15 ENCOUNTER — Emergency Department (HOSPITAL_BASED_OUTPATIENT_CLINIC_OR_DEPARTMENT_OTHER)
Admission: EM | Admit: 2022-12-15 | Discharge: 2022-12-15 | Disposition: A | Discharge status: Home / Self Care | Payer: Managed Care, Other (non HMO) | Attending: Emergency Medicine | Admitting: Emergency Medicine

## 2022-12-15 ENCOUNTER — Other Ambulatory Visit: Payer: Self-pay

## 2022-12-15 ENCOUNTER — Encounter (HOSPITAL_BASED_OUTPATIENT_CLINIC_OR_DEPARTMENT_OTHER): Payer: Self-pay

## 2022-12-15 DIAGNOSIS — R110 Nausea: Secondary | ICD-10-CM | POA: Insufficient documentation

## 2022-12-15 DIAGNOSIS — O219 Vomiting of pregnancy, unspecified: Secondary | ICD-10-CM | POA: Insufficient documentation

## 2022-12-15 DIAGNOSIS — Z1152 Encounter for screening for COVID-19: Secondary | ICD-10-CM | POA: Diagnosis not present

## 2022-12-15 DIAGNOSIS — E871 Hypo-osmolality and hyponatremia: Secondary | ICD-10-CM | POA: Diagnosis not present

## 2022-12-15 DIAGNOSIS — N3000 Acute cystitis without hematuria: Secondary | ICD-10-CM | POA: Insufficient documentation

## 2022-12-15 DIAGNOSIS — O26899 Other specified pregnancy related conditions, unspecified trimester: Secondary | ICD-10-CM | POA: Diagnosis not present

## 2022-12-15 DIAGNOSIS — Z3A00 Weeks of gestation of pregnancy not specified: Secondary | ICD-10-CM | POA: Diagnosis not present

## 2022-12-15 DIAGNOSIS — R531 Weakness: Secondary | ICD-10-CM | POA: Diagnosis not present

## 2022-12-15 DIAGNOSIS — Z3490 Encounter for supervision of normal pregnancy, unspecified, unspecified trimester: Secondary | ICD-10-CM

## 2022-12-15 LAB — URINALYSIS, ROUTINE W REFLEX MICROSCOPIC
Bilirubin Urine: NEGATIVE
Glucose, UA: NEGATIVE mg/dL
Ketones, ur: NEGATIVE mg/dL
Nitrite: POSITIVE — AB
Protein, ur: NEGATIVE mg/dL
Specific Gravity, Urine: 1.02 (ref 1.005–1.030)
pH: 8.5 — ABNORMAL HIGH (ref 5.0–8.0)

## 2022-12-15 LAB — RESP PANEL BY RT-PCR (RSV, FLU A&B, COVID)  RVPGX2
Influenza A by PCR: NEGATIVE
Influenza B by PCR: NEGATIVE
Resp Syncytial Virus by PCR: NEGATIVE
SARS Coronavirus 2 by RT PCR: NEGATIVE

## 2022-12-15 LAB — COMPREHENSIVE METABOLIC PANEL
ALT: 10 U/L (ref 0–44)
AST: 12 U/L — ABNORMAL LOW (ref 15–41)
Albumin: 3.7 g/dL (ref 3.5–5.0)
Alkaline Phosphatase: 53 U/L (ref 38–126)
Anion gap: 11 (ref 5–15)
BUN: 6 mg/dL (ref 6–20)
CO2: 21 mmol/L — ABNORMAL LOW (ref 22–32)
Calcium: 8.6 mg/dL — ABNORMAL LOW (ref 8.9–10.3)
Chloride: 100 mmol/L (ref 98–111)
Creatinine, Ser: 0.59 mg/dL (ref 0.44–1.00)
GFR, Estimated: >60 mL/min (ref >60)
Glucose, Bld: 86 mg/dL (ref 70–99)
Potassium: 3.7 mmol/L (ref 3.5–5.1)
Sodium: 132 mmol/L — ABNORMAL LOW (ref 135–145)
Total Bilirubin: 0.7 mg/dL (ref 0.3–1.2)
Total Protein: 7.3 g/dL (ref 6.5–8.1)

## 2022-12-15 LAB — CBC
HCT: 31.6 % — ABNORMAL LOW (ref 36.0–46.0)
Hemoglobin: 10.4 g/dL — ABNORMAL LOW (ref 12.0–15.0)
MCH: 27.5 pg (ref 26.0–34.0)
MCHC: 32.9 g/dL (ref 30.0–36.0)
MCV: 83.6 fL (ref 80.0–100.0)
Platelets: 228 10*3/uL (ref 150–400)
RBC: 3.78 MIL/uL — ABNORMAL LOW (ref 3.87–5.11)
RDW: 15.2 % (ref 11.5–15.5)
WBC: 5.6 10*3/uL (ref 4.0–10.5)
nRBC: 0 % (ref 0.0–0.2)

## 2022-12-15 LAB — HCG, QUANTITATIVE, PREGNANCY: hCG, Beta Chain, Quant, S: 91093 m[IU]/mL — ABNORMAL HIGH (ref <5)

## 2022-12-15 LAB — T4, FREE: Free T4: 0.95 ng/dL (ref 0.61–1.12)

## 2022-12-15 LAB — TSH: TSH: 0.196 u[IU]/mL — ABNORMAL LOW (ref 0.350–4.500)

## 2022-12-15 LAB — URINALYSIS, MICROSCOPIC (REFLEX)

## 2022-12-15 LAB — LIPASE, BLOOD: Lipase: 22 U/L (ref 11–51)

## 2022-12-15 LAB — PREGNANCY, URINE: Preg Test, Ur: POSITIVE — AB

## 2022-12-15 MED ORDER — SODIUM CHLORIDE 0.9 % IV BOLUS
1000.0000 mL | Freq: Once | INTRAVENOUS | Status: AC
  Administered 2022-12-15: 1000 mL via INTRAVENOUS

## 2022-12-15 MED ORDER — METOCLOPRAMIDE HCL 5 MG/ML IJ SOLN
10.0000 mg | Freq: Once | INTRAMUSCULAR | Status: AC
  Administered 2022-12-15: 10 mg via INTRAVENOUS
  Filled 2022-12-15: qty 2

## 2022-12-15 MED ORDER — CEFADROXIL 500 MG PO CAPS: 500.0000 mg | ORAL_CAPSULE | Freq: Two times a day (BID) | ORAL | 0 refills | Status: AC

## 2022-12-15 MED ORDER — DOXYLAMINE-PYRIDOXINE 10-10 MG PO TBEC: 2.0000 | DELAYED_RELEASE_TABLET | Freq: Every day | ORAL | 1 refills | Status: AC

## 2022-12-15 MED ORDER — CEPHALEXIN 250 MG PO CAPS
1000.0000 mg | ORAL_CAPSULE | Freq: Once | ORAL | Status: AC
  Administered 2022-12-15: 1000 mg via ORAL
  Filled 2022-12-15: qty 4

## 2022-12-15 NOTE — ED Provider Notes (Signed)
[place] EMERGENCY DEPARTMENT AT [facility] Provider Note   CSN: [id number] Arrival date & time: 12/15/22  1102     History  Chief Complaint  Patient presents with   Weakness    Jean Duncan is a 32 y.o. female with overall noncontributory past medical history who presents with concern for generalized weakness that started last week.  She endorses nausea, vomiting, chills.  She reports she has had difficulty keeping food down.  She has been able to tolerate water.  Patient reports last menstrual cycle was in June, she had a positive pregnancy test 3 weeks ago but is not confirmed since then, does not currently have a OB/GYN.   Weakness Associated symptoms: nausea and vomiting        Home Medications Prior to Admission medications   Medication Sig Start Date End Date Taking? Authorizing Provider  cefadroxil (DURICEF) 500 MG capsule Take 1 capsule (500 mg total) by mouth 2 (two) times daily. 12/15/22  Yes Stefon Ramthun H, PA-C  Doxylamine-Pyridoxine 10-10 MG TBEC Take 2 tablets by mouth at bedtime. You can begin taking the medication 2 tablets by mouth at bedtime, if this is not effective you can add an additional 1 tablet in the morning 12/15/22  Yes Cristino Degroff H, PA-C  ondansetron (ZOFRAN ODT) 4 MG disintegrating tablet Take 1 tablet (4 mg total) by mouth every 8 (eight) hours as needed for nausea or vomiting. 04/27/18   Michela Pitcher A, PA-C      Allergies    Patient has no known allergies.    Review of Systems   Review of Systems  Gastrointestinal:  Positive for nausea and vomiting.  Neurological:  Positive for weakness.  All other systems reviewed and are negative.   Physical Exam Updated Vital Signs BP 129/75   Pulse 79   Temp 98.3 F (36.8 C)   Resp 17   Ht 5\' 7"  (1.702 m)   Wt 91.6 kg   SpO2 100%   BMI 31.64 kg/m  Physical Exam Vitals and nursing note reviewed.  Constitutional:      General: She is not in acute distress.     Appearance: Normal appearance.  HENT:     Head: Normocephalic and atraumatic.  Eyes:     General:        Right eye: No discharge.        Left eye: No discharge.  Cardiovascular:     Rate and Rhythm: Normal rate and regular rhythm.     Heart sounds: No murmur heard.    No friction rub. No gallop.  Pulmonary:     Effort: Pulmonary effort is normal.     Breath sounds: Normal breath sounds.  Abdominal:     General: Bowel sounds are normal.     Palpations: Abdomen is soft.     Comments: Uterine fundus not significantly palpable, no significant tenderness to palpation of the abdomen, no rebound, rigidity, guarding throughout.  Skin:    General: Skin is warm and dry.     Capillary Refill: Capillary refill takes less than 2 seconds.  Neurological:     Mental Status: She is alert and oriented to person, place, and time.  Psychiatric:        Mood and Affect: Mood normal.        Behavior: Behavior normal.     ED Results / Procedures / Treatments   Labs (all labs ordered are listed, but only abnormal results are displayed) Labs Reviewed  COMPREHENSIVE  METABOLIC PANEL - Abnormal; Notable for the following components:      Result Value   Sodium 132 (*)    CO2 21 (*)    Calcium 8.6 (*)    AST 12 (*)    All other components within normal limits  CBC - Abnormal; Notable for the following components:   RBC 3.78 (*)    Hemoglobin 10.4 (*)    HCT 31.6 (*)    All other components within normal limits  URINALYSIS, ROUTINE W REFLEX MICROSCOPIC - Abnormal; Notable for the following components:   pH 8.5 (*)    Hgb urine dipstick TRACE (*)    Nitrite POSITIVE (*)    Leukocytes,Ua TRACE (*)    All other components within normal limits  PREGNANCY, URINE - Abnormal; Notable for the following components:   Preg Test, Ur POSITIVE (*)    All other components within normal limits  HCG, QUANTITATIVE, PREGNANCY - Abnormal; Notable for the following components:   hCG, Beta Chain, Quant, S 91,093  (*)    All other components within normal limits  URINALYSIS, MICROSCOPIC (REFLEX) - Abnormal; Notable for the following components:   Bacteria, UA MANY (*)    All other components within normal limits  RESP PANEL BY RT-PCR (RSV, FLU A&B, COVID)  RVPGX2  LIPASE, BLOOD  TSH  T4, FREE    EKG None  Radiology No results found.  Procedures Procedures    Medications Ordered in ED Medications  sodium chloride 0.9 % bolus 1,000 mL (0 mLs Intravenous Stopped 12/15/22 1339)  metoCLOPramide (REGLAN) injection 10 mg (10 mg Intravenous Given 12/15/22 1234)  cephALEXin (KEFLEX) capsule 1,000 mg (1,000 mg Oral Given 12/15/22 1233)    ED Course/ Medical Decision Making/ A&P                                 Medical Decision Making Amount and/or Complexity of Data Reviewed Labs: ordered.   This patient is a 32 y.o. female  who presents to the ED for concern of general weakness, nausea, vomiting, chills, positive pregnancy test at home.   Differential diagnoses prior to evaluation: The emergent differential diagnosis includes, but is not limited to,  CVA, spinal cord injury, ACS, arrhythmia, syncope, orthostatic hypotension, sepsis, hypoglycemia, hypoxia, electrolyte disturbance, endocrine disorder, anemia, environmental exposure, polypharmacy, UTI, hyperemesis gravidarum . This is not an exhaustive differential.   Past Medical History / Co-morbidities / Social History: Overall noncontributory  Physical Exam: Physical exam performed. The pertinent findings include: Uterine fundus not significantly palpable, no significant tenderness to palpation of the abdomen, no rebound, rigidity, guarding throughout.  Overall stable vital signs other than mild hypertension on arrival, bp 144/89. Improved at dc to 129/75. Tolerating po at time of discharge.  Lab Tests/Imaging studies: I personally interpreted labs/imaging and the pertinent results include: CMP notable for mild hyponatremia, sodium 132,  we will replete with IV fluids, RVP negative, urine pregnancy test and quantitative hCG indicate a pregnancy, suspect 8 to 12 weeks based on last menstrual cycle last week in June.  CBC is notable for mild anemia, hemoglobin 10.4, not significantly changed from baseline.  UA with many bacteria, positive nitrites and trace leukocytes, suspicious for active urinary tract infection, especially in context of pregnancy we will treat.  1 dose of Keflex administered in the emergency department, will discharge with cefadroxil.    Medications: I ordered medication including fluid bolus, Reglan, Keflex.  I  have reviewed the patients home medicines and have made adjustments as needed.  Will discharge with Diclegis and encourage close OB/GYN follow-up.  Patient discharged with [place] treat for UTI.   Disposition: After consideration of the diagnostic results and the patients response to treatment, I feel that patient stable for discharge with plan as above.   emergency department workup does not suggest an emergent condition requiring admission or immediate intervention beyond what has been performed at this time. The plan is: as above. The patient is safe for discharge and has been instructed to return immediately for worsening symptoms, change in symptoms or any other concerns.  Final Clinical Impression(s) / ED Diagnoses Final diagnoses:  Pregnancy, unspecified gestational age  Acute cystitis without hematuria  Hyponatremia    Rx / DC Orders ED Discharge Orders          Ordered    cefadroxil (DURICEF) 500 MG capsule  2 times daily        12/15/22 1402    Doxylamine-Pyridoxine 10-10 MG TBEC  Daily at bedtime        12/15/22 1402              Alexsandria Kivett, [place] H, PA-C 12/15/22 1425    Tanda Rockers A, DO 12/19/22 1255

## 2022-12-15 NOTE — ED Notes (Signed)
Urine specimen in lab 

## 2022-12-15 NOTE — Discharge Instructions (Addendum)
I prescribed the nausea medication that we talked about, as we discussed if for some reason this is expensive for your insurance you can make it yourself at home by combining the separate ingredients: These are doxylamine (Unisom) 10 mg, and pyridoxine (B6) 10 mg.  The proper dose would be 2 of each of these tablets at bedtime, with an additional 1 tablet of each in the morning if dosing at bedtime is not sufficient. Please begin taking a prenatal vitamin and follow up closely with your OBGYN as planned.   Please take the entire course of antibiotics that prescribed to cover for the urinary tract infection we found today.

## 2022-12-15 NOTE — ED Triage Notes (Signed)
Pt reports weakness that started last week. Reports NV, and chills that that started on Monday. Unable to keep food down. Able to tolerate water.

## 2023-06-05 ENCOUNTER — Other Ambulatory Visit: Payer: Self-pay

## 2023-06-05 ENCOUNTER — Encounter (HOSPITAL_BASED_OUTPATIENT_CLINIC_OR_DEPARTMENT_OTHER): Payer: Self-pay | Admitting: Urology

## 2023-06-05 DIAGNOSIS — O99513 Diseases of the respiratory system complicating pregnancy, third trimester: Secondary | ICD-10-CM | POA: Diagnosis present

## 2023-06-05 DIAGNOSIS — Z1152 Encounter for screening for COVID-19: Secondary | ICD-10-CM | POA: Insufficient documentation

## 2023-06-05 DIAGNOSIS — R059 Cough, unspecified: Secondary | ICD-10-CM | POA: Diagnosis not present

## 2023-06-05 DIAGNOSIS — Z5321 Procedure and treatment not carried out due to patient leaving prior to being seen by health care provider: Secondary | ICD-10-CM | POA: Insufficient documentation

## 2023-06-05 LAB — RESP PANEL BY RT-PCR (RSV, FLU A&B, COVID)  RVPGX2
Influenza A by PCR: NEGATIVE
Influenza B by PCR: NEGATIVE
Resp Syncytial Virus by PCR: NEGATIVE
SARS Coronavirus 2 by RT PCR: NEGATIVE

## 2023-06-05 NOTE — ED Triage Notes (Signed)
Pt states body aches and cough that started yesterday Denies fever    Pt [redacted] week gestation, no vaginal bleeding or discharge  Normal fetal movement per pt  OB/GYN [facility]  G-2 P-1

## 2023-06-06 ENCOUNTER — Emergency Department (HOSPITAL_BASED_OUTPATIENT_CLINIC_OR_DEPARTMENT_OTHER)
Admission: EM | Admit: 2023-06-06 | Discharge: 2023-06-06 | Discharge status: Against Medical Advice | Payer: Managed Care, Other (non HMO) | Attending: Emergency Medicine | Admitting: Emergency Medicine

## 2023-06-06 NOTE — ED Notes (Signed)
RN called pt X3 for room, no response.
